# Patient Record
Sex: Male | Born: 1950 | Race: White | Hispanic: No | Smoking: Former smoker
Health system: Southern US, Community
[De-identification: ages and names within clinical notes are randomized; demographics above are authoritative.]

## PROBLEM LIST (undated history)

## (undated) HISTORY — PX: CORONARY ARTERY BYPASS GRAFT: SHX141

---

## 2017-02-25 ENCOUNTER — Emergency Department (HOSPITAL_COMMUNITY): Payer: Medicare (Managed Care) | Admitting: Anesthesiology

## 2017-02-25 ENCOUNTER — Encounter (HOSPITAL_COMMUNITY): Admission: EM | Disposition: E | Payer: Self-pay | Source: Home / Self Care | Attending: Surgery

## 2017-02-25 ENCOUNTER — Emergency Department (HOSPITAL_COMMUNITY): Payer: Medicare (Managed Care)

## 2017-02-25 ENCOUNTER — Inpatient Hospital Stay (HOSPITAL_COMMUNITY)
Admission: EM | Admit: 2017-02-25 | Discharge: 2017-03-07 | DRG: 268 | Disposition: E | Payer: Medicare (Managed Care) | Attending: Surgery | Admitting: Surgery

## 2017-02-25 ENCOUNTER — Encounter (HOSPITAL_COMMUNITY): Payer: Self-pay | Admitting: *Deleted

## 2017-02-25 DIAGNOSIS — R578 Other shock: Secondary | ICD-10-CM | POA: Diagnosis present

## 2017-02-25 DIAGNOSIS — R7989 Other specified abnormal findings of blood chemistry: Secondary | ICD-10-CM

## 2017-02-25 DIAGNOSIS — R079 Chest pain, unspecified: Secondary | ICD-10-CM | POA: Diagnosis present

## 2017-02-25 DIAGNOSIS — J9602 Acute respiratory failure with hypercapnia: Secondary | ICD-10-CM | POA: Diagnosis present

## 2017-02-25 DIAGNOSIS — I739 Peripheral vascular disease, unspecified: Secondary | ICD-10-CM | POA: Diagnosis present

## 2017-02-25 DIAGNOSIS — N179 Acute kidney failure, unspecified: Secondary | ICD-10-CM

## 2017-02-25 DIAGNOSIS — M79A3 Nontraumatic compartment syndrome of abdomen: Secondary | ICD-10-CM | POA: Diagnosis present

## 2017-02-25 DIAGNOSIS — Z951 Presence of aortocoronary bypass graft: Secondary | ICD-10-CM

## 2017-02-25 DIAGNOSIS — R402252 Coma scale, best verbal response, oriented, at arrival to emergency department: Secondary | ICD-10-CM | POA: Diagnosis present

## 2017-02-25 DIAGNOSIS — E872 Acidosis, unspecified: Secondary | ICD-10-CM

## 2017-02-25 DIAGNOSIS — Z01818 Encounter for other preprocedural examination: Secondary | ICD-10-CM

## 2017-02-25 DIAGNOSIS — Z87891 Personal history of nicotine dependence: Secondary | ICD-10-CM | POA: Diagnosis not present

## 2017-02-25 DIAGNOSIS — M545 Low back pain, unspecified: Secondary | ICD-10-CM

## 2017-02-25 DIAGNOSIS — J9601 Acute respiratory failure with hypoxia: Secondary | ICD-10-CM | POA: Diagnosis present

## 2017-02-25 DIAGNOSIS — E874 Mixed disorder of acid-base balance: Secondary | ICD-10-CM | POA: Diagnosis present

## 2017-02-25 DIAGNOSIS — W19XXXA Unspecified fall, initial encounter: Secondary | ICD-10-CM | POA: Diagnosis present

## 2017-02-25 DIAGNOSIS — I469 Cardiac arrest, cause unspecified: Secondary | ICD-10-CM | POA: Diagnosis present

## 2017-02-25 DIAGNOSIS — R748 Abnormal levels of other serum enzymes: Secondary | ICD-10-CM | POA: Diagnosis present

## 2017-02-25 DIAGNOSIS — J9622 Acute and chronic respiratory failure with hypercapnia: Secondary | ICD-10-CM | POA: Diagnosis not present

## 2017-02-25 DIAGNOSIS — R402313 Coma scale, best motor response, none, at hospital admission: Secondary | ICD-10-CM | POA: Diagnosis present

## 2017-02-25 DIAGNOSIS — E875 Hyperkalemia: Secondary | ICD-10-CM | POA: Diagnosis present

## 2017-02-25 DIAGNOSIS — R402113 Coma scale, eyes open, never, at hospital admission: Secondary | ICD-10-CM | POA: Diagnosis present

## 2017-02-25 DIAGNOSIS — D689 Coagulation defect, unspecified: Secondary | ICD-10-CM | POA: Diagnosis present

## 2017-02-25 DIAGNOSIS — J9621 Acute and chronic respiratory failure with hypoxia: Secondary | ICD-10-CM | POA: Diagnosis not present

## 2017-02-25 DIAGNOSIS — R402362 Coma scale, best motor response, obeys commands, at arrival to emergency department: Secondary | ICD-10-CM | POA: Diagnosis present

## 2017-02-25 DIAGNOSIS — R55 Syncope and collapse: Secondary | ICD-10-CM | POA: Diagnosis present

## 2017-02-25 DIAGNOSIS — R402213 Coma scale, best verbal response, none, at hospital admission: Secondary | ICD-10-CM | POA: Diagnosis present

## 2017-02-25 DIAGNOSIS — R000 Tachycardia, unspecified: Secondary | ICD-10-CM | POA: Diagnosis present

## 2017-02-25 DIAGNOSIS — R402142 Coma scale, eyes open, spontaneous, at arrival to emergency department: Secondary | ICD-10-CM | POA: Diagnosis present

## 2017-02-25 DIAGNOSIS — D62 Acute posthemorrhagic anemia: Secondary | ICD-10-CM | POA: Diagnosis present

## 2017-02-25 DIAGNOSIS — R778 Other specified abnormalities of plasma proteins: Secondary | ICD-10-CM

## 2017-02-25 DIAGNOSIS — I718 Aortic aneurysm of unspecified site, ruptured: Secondary | ICD-10-CM | POA: Diagnosis present

## 2017-02-25 DIAGNOSIS — I713 Abdominal aortic aneurysm, ruptured: Principal | ICD-10-CM | POA: Diagnosis present

## 2017-02-25 HISTORY — PX: ABDOMINAL AORTIC ANEURYSM REPAIR: SHX42

## 2017-02-25 HISTORY — PX: ABDOMINAL AORTIC ENDOVASCULAR STENT GRAFT: SHX5707

## 2017-02-25 LAB — CBC
HEMATOCRIT: 32 % — AB (ref 39.0–52.0)
HEMOGLOBIN: 10.2 g/dL — AB (ref 13.0–17.0)
MCH: 28.6 pg (ref 26.0–34.0)
MCHC: 31.9 g/dL (ref 30.0–36.0)
MCV: 89.6 fL (ref 78.0–100.0)
Platelets: 162 10*3/uL (ref 150–400)
RBC: 3.57 MIL/uL — ABNORMAL LOW (ref 4.22–5.81)
RDW: 15.3 % (ref 11.5–15.5)
WBC: 16.3 10*3/uL — AB (ref 4.0–10.5)

## 2017-02-25 LAB — I-STAT TROPONIN, ED: TROPONIN I, POC: 0.09 ng/mL — AB (ref 0.00–0.08)

## 2017-02-25 LAB — BASIC METABOLIC PANEL
ANION GAP: 19 — AB (ref 5–15)
BUN: 32 mg/dL — AB (ref 6–20)
CALCIUM: 7.9 mg/dL — AB (ref 8.9–10.3)
CO2: 14 mmol/L — ABNORMAL LOW (ref 22–32)
Chloride: 108 mmol/L (ref 101–111)
Creatinine, Ser: 3.74 mg/dL — ABNORMAL HIGH (ref 0.61–1.24)
GFR calc Af Amer: 18 mL/min — ABNORMAL LOW (ref >60)
GFR, EST NON AFRICAN AMERICAN: 16 mL/min — AB (ref >60)
Glucose, Bld: 156 mg/dL — ABNORMAL HIGH (ref 65–99)
POTASSIUM: 4.1 mmol/L (ref 3.5–5.1)
Sodium: 141 mmol/L (ref 135–145)

## 2017-02-25 LAB — CK: Total CK: 105 U/L (ref 49–397)

## 2017-02-25 LAB — I-STAT CG4 LACTIC ACID, ED: Lactic Acid, Venous: 10.36 mmol/L (ref 0.5–1.9)

## 2017-02-25 SURGERY — ANEURYSM ABDOMINAL AORTIC REPAIR
Anesthesia: General

## 2017-02-25 MED ORDER — SODIUM CHLORIDE 0.9 % IV BOLUS (SEPSIS)
500.0000 mL | Freq: Once | INTRAVENOUS | Status: AC
  Administered 2017-02-25: 500 mL via INTRAVENOUS

## 2017-02-25 MED ORDER — EPINEPHRINE PF 1 MG/ML IJ SOLN
0.5000 ug/min | INTRAMUSCULAR | Status: DC
  Administered 2017-02-26: 1 ug/min via INTRAVENOUS
  Administered 2017-02-26: 20 ug/min via INTRAVENOUS
  Filled 2017-02-25 (×2): qty 4

## 2017-02-25 MED ORDER — 0.9 % SODIUM CHLORIDE (POUR BTL) OPTIME
TOPICAL | Status: DC | PRN
  Administered 2017-02-25: 4000 mL

## 2017-02-25 MED ORDER — EPINEPHRINE PF 1 MG/10ML IJ SOSY
PREFILLED_SYRINGE | INTRAMUSCULAR | Status: AC | PRN
  Administered 2017-02-25 (×3): 1 mg via INTRAVENOUS

## 2017-02-25 MED ORDER — NOREPINEPHRINE BITARTRATE 1 MG/ML IV SOLN
0.0000 ug/min | INTRAVENOUS | Status: DC
  Administered 2017-02-26: 10 ug/min via INTRAVENOUS
  Filled 2017-02-25 (×2): qty 16

## 2017-02-25 MED ORDER — FENTANYL CITRATE (PF) 100 MCG/2ML IJ SOLN
INTRAMUSCULAR | Status: AC
  Filled 2017-02-25: qty 2

## 2017-02-25 MED ORDER — EPINEPHRINE PF 1 MG/10ML IJ SOSY
PREFILLED_SYRINGE | INTRAMUSCULAR | Status: AC | PRN
  Administered 2017-02-25: 1 mg via INTRAVENOUS

## 2017-02-25 MED ORDER — SODIUM CHLORIDE 0.9 % IV BOLUS (SEPSIS)
1000.0000 mL | Freq: Once | INTRAVENOUS | Status: AC
  Administered 2017-02-25: 1000 mL via INTRAVENOUS

## 2017-02-25 MED ORDER — FENTANYL CITRATE (PF) 100 MCG/2ML IJ SOLN
50.0000 ug | INTRAMUSCULAR | Status: DC | PRN
  Administered 2017-02-25: 50 ug via INTRAVENOUS

## 2017-02-25 MED ORDER — EPINEPHRINE PF 1 MG/10ML IJ SOSY
PREFILLED_SYRINGE | INTRAMUSCULAR | Status: AC | PRN
  Administered 2017-02-25 (×2): 1 mg via INTRAVENOUS

## 2017-02-25 MED ORDER — ONDANSETRON HCL 4 MG/2ML IJ SOLN
4.0000 mg | Freq: Once | INTRAMUSCULAR | Status: AC
  Administered 2017-02-25: 4 mg via INTRAVENOUS
  Filled 2017-02-25: qty 2

## 2017-02-25 MED ORDER — IOPAMIDOL (ISOVUE-370) INJECTION 76%
INTRAVENOUS | Status: AC
  Administered 2017-02-25: 100 mL
  Filled 2017-02-25: qty 100

## 2017-02-25 MED ORDER — SODIUM CHLORIDE 0.9 % IV SOLN
INTRAVENOUS | Status: DC | PRN
  Administered 2017-02-25: 1000 mL

## 2017-02-25 MED ORDER — SODIUM CHLORIDE 0.9 % IV SOLN
0.0300 [IU]/min | INTRAVENOUS | Status: DC
  Administered 2017-02-26: .03 [IU]/min via INTRAVENOUS
  Filled 2017-02-25: qty 2

## 2017-02-25 SURGICAL SUPPLY — 86 items
BAG SNAP BAND KOVER 36X36 (MISCELLANEOUS) ×4 IMPLANT
CANISTER SUCT 3000ML PPV (MISCELLANEOUS) ×4 IMPLANT
CATH OMNI FLUSH .035X70CM (CATHETERS) ×4 IMPLANT
CLIP VESOCCLUDE MED 24/CT (CLIP) ×4 IMPLANT
CLIP VESOCCLUDE SM WIDE 24/CT (CLIP) ×4 IMPLANT
CONNECTOR Y ATS VAC SYSTEM (MISCELLANEOUS) ×4 IMPLANT
COVER MAYO STAND STRL (DRAPES) ×8 IMPLANT
DERMABOND ADVANCED (GAUZE/BANDAGES/DRESSINGS) ×4
DERMABOND ADVANCED .7 DNX12 (GAUZE/BANDAGES/DRESSINGS) ×4 IMPLANT
DEVICE CLOSURE PERCLS PRGLD 6F (VASCULAR PRODUCTS) ×4 IMPLANT
DRAPE TABLE BACK 80X90 (DRAPES) ×4 IMPLANT
DRSG VAC ATS SM SENSATRAC (GAUZE/BANDAGES/DRESSINGS) ×8 IMPLANT
DRYSEAL FLEXSHEATH 12FR 33CM (SHEATH) ×2
DRYSEAL FLEXSHEATH 12FR 45CM (SHEATH) ×2
DRYSEAL FLEXSHEATH 16FR 33CM (SHEATH) ×2
DRYSEAL FLEXSHEATH 18FR 33CM (SHEATH) ×2
ELECT BLADE 4.0 EZ CLEAN MEGAD (MISCELLANEOUS) ×8
ELECT BLADE 6.5 EXT (BLADE) IMPLANT
ELECT REM PT RETURN 9FT ADLT (ELECTROSURGICAL) ×4
ELECTRODE BLDE 4.0 EZ CLN MEGD (MISCELLANEOUS) ×4 IMPLANT
ELECTRODE REM PT RTRN 9FT ADLT (ELECTROSURGICAL) ×2 IMPLANT
EXCLUDER TNK 28X14.5MMX12CM (Endovascular Graft) ×2 IMPLANT
EXCLUDER TRUNK 28X14.5MMX12CM (Endovascular Graft) ×4 IMPLANT
EXTENDER ENDOPROSTHESIS 12X7 (Endovascular Graft) ×4 IMPLANT
GLOVE BIO SURGEON STRL SZ 6.5 (GLOVE) ×9 IMPLANT
GLOVE BIO SURGEON STRL SZ7 (GLOVE) ×12 IMPLANT
GLOVE BIO SURGEONS STRL SZ 6.5 (GLOVE) ×3
GLOVE BIOGEL PI IND STRL 6.5 (GLOVE) ×6 IMPLANT
GLOVE BIOGEL PI IND STRL 7.0 (GLOVE) ×6 IMPLANT
GLOVE BIOGEL PI IND STRL 7.5 (GLOVE) ×2 IMPLANT
GLOVE BIOGEL PI INDICATOR 6.5 (GLOVE) ×6
GLOVE BIOGEL PI INDICATOR 7.0 (GLOVE) ×6
GLOVE BIOGEL PI INDICATOR 7.5 (GLOVE) ×2
GLOVE SURG SS PI 7.5 STRL IVOR (GLOVE) ×4 IMPLANT
GOWN STRL REUS W/ TWL LRG LVL3 (GOWN DISPOSABLE) ×8 IMPLANT
GOWN STRL REUS W/ TWL XL LVL3 (GOWN DISPOSABLE) ×4 IMPLANT
GOWN STRL REUS W/TWL LRG LVL3 (GOWN DISPOSABLE) ×8
GOWN STRL REUS W/TWL XL LVL3 (GOWN DISPOSABLE) ×4
GRAFT BALLN CATH 65CM (STENTS) ×2 IMPLANT
GUIDEWIRE AMPLATZ STIFF 0.35 (WIRE) ×12 IMPLANT
HEMOSTAT SNOW SURGICEL 2X4 (HEMOSTASIS) IMPLANT
INSERT FOGARTY 61MM (MISCELLANEOUS) ×8 IMPLANT
INSERT FOGARTY SM (MISCELLANEOUS) ×16 IMPLANT
KIT BASIN OR (CUSTOM PROCEDURE TRAY) ×4 IMPLANT
KIT ROOM TURNOVER OR (KITS) ×4 IMPLANT
LEG CONTRALATERAL 16X16X13.5 (Endovascular Graft) ×2 IMPLANT
LEG CONTRALATERAL 16X16X9.5 (Endovascular Graft) ×4 IMPLANT
NEEDLE PERC 18GX7CM (NEEDLE) ×4 IMPLANT
NS IRRIG 1000ML POUR BTL (IV SOLUTION) ×8 IMPLANT
PACK AORTA (CUSTOM PROCEDURE TRAY) ×4 IMPLANT
PAD ARMBOARD 7.5X6 YLW CONV (MISCELLANEOUS) ×8 IMPLANT
PERCLOSE PROGLIDE 6F (VASCULAR PRODUCTS) ×8
PROTECTION STATION PRESSURIZED (MISCELLANEOUS) ×4
SET MICROPUNCTURE 5F STIFF (MISCELLANEOUS) ×8 IMPLANT
SHEATH BRITE TIP 8FR 23CM (MISCELLANEOUS) ×4 IMPLANT
SHEATH DRYSEAL FLEX 12FR 33CM (SHEATH) ×2 IMPLANT
SHEATH DRYSEAL FLEX 12FR 45CM (SHEATH) ×2 IMPLANT
SHEATH DRYSEAL FLEX 16FR 33CM (SHEATH) ×2 IMPLANT
SHEATH DRYSEAL FLEX 18FR 33CM (SHEATH) ×2 IMPLANT
SHEATH PINNACLE 8F 10CM (SHEATH) ×4 IMPLANT
SPONGE LAP 18X18 X RAY DECT (DISPOSABLE) ×16 IMPLANT
STATION PROTECTION PRESSURIZED (MISCELLANEOUS) ×2 IMPLANT
STENT GRAFT BALLN CATH 65CM (STENTS) ×2
STENT GRAFT CONTRALAT 16X13.5 (Endovascular Graft) ×2 IMPLANT
STOPCOCK MORSE 400PSI 3WAY (MISCELLANEOUS) ×4 IMPLANT
SUT ETHIBOND 5 LR DA (SUTURE) IMPLANT
SUT PDS AB 1 TP1 54 (SUTURE) ×8 IMPLANT
SUT PROLENE 3 0 SH 48 (SUTURE) ×12 IMPLANT
SUT PROLENE 5 0 C 1 24 (SUTURE) ×16 IMPLANT
SUT PROLENE 5 0 C 1 36 (SUTURE) IMPLANT
SUT SILK 2 0SH CR/8 30 (SUTURE) ×4 IMPLANT
SUT VIC AB 2-0 CT1 27 (SUTURE) ×4
SUT VIC AB 2-0 CT1 TAPERPNT 27 (SUTURE) ×4 IMPLANT
SUT VIC AB 3-0 SH 27 (SUTURE)
SUT VIC AB 3-0 SH 27X BRD (SUTURE) IMPLANT
SUT VICRYL 4-0 PS2 18IN ABS (SUTURE) ×8 IMPLANT
SYR 20CC LL (SYRINGE) ×4 IMPLANT
SYR 30ML LL (SYRINGE) ×4 IMPLANT
SYR MEDRAD MARK V 150ML (SYRINGE) ×4 IMPLANT
TOWEL BLUE STERILE X RAY DET (MISCELLANEOUS) ×8 IMPLANT
TOWEL GREEN STERILE (TOWEL DISPOSABLE) ×4 IMPLANT
TRAY FOLEY W/METER SILVER 16FR (SET/KITS/TRAYS/PACK) ×4 IMPLANT
TUBING HIGH PRESSURE 120CM (CONNECTOR) ×4 IMPLANT
WATER STERILE IRR 1000ML POUR (IV SOLUTION) ×8 IMPLANT
WIRE BENTSON .035X145CM (WIRE) ×8 IMPLANT
WND VAC CANISTER 500ML (MISCELLANEOUS) ×12 IMPLANT

## 2017-02-25 NOTE — Code Documentation (Signed)
Unit 2 PRBCs started via [facility] ([id number])

## 2017-02-25 NOTE — Code Documentation (Addendum)
Vascular surgeon arrival time Brabham

## 2017-02-25 NOTE — Code Documentation (Signed)
Pt to OR with Ethan Avery, Greg, Barbham,

## 2017-02-25 NOTE — Code Documentation (Signed)
Lucas applied

## 2017-02-25 NOTE — ED Notes (Signed)
Sent add on label for hfp blood test.

## 2017-02-25 NOTE — Code Documentation (Signed)
1000cc NS complete via [facility]

## 2017-02-25 NOTE — Code Documentation (Signed)
Unit 4 PRBCs complete via [facility]

## 2017-02-25 NOTE — ED Provider Notes (Signed)
[facility]   CSN: [id number] Arrival date & time: 03/05/2017  1924     History   Chief Complaint Chief Complaint  Patient presents with  . Fall  . Chest Pain    HPI Ethan Avery is a 66 y.o. male.  Patient with coronary artery bypass history, previous smoker presents with severe lower back pain and chest pain. Patient had a few episodes of both the past 2 days however worsening. The back pain is more severe than the chest pain. No history of similar. No radiation down the legs. Patient did have hernia repair 2 months ago and has minimal shortness of breath. No history of blood clots. Patient has no abdominal pain.patient describes worsening pain followed by syncope approximately 12 hours ago. Patient lying on the ground for 12 hours unable to get up.      History reviewed. No pertinent past medical history.  There are no active problems to display for this patient.   Past Surgical History:  Procedure Laterality Date  . CORONARY ARTERY BYPASS GRAFT         Home Medications    Prior to Admission medications   Not on File    Family History No family history on file.  Social History Social History   Tobacco Use  . Smoking status: Former Smoker    Last attempt to quit: 03/08/2011    Years since quitting: 5.9  . Smokeless tobacco: Never Used  Substance Use Topics  . Alcohol use: No    Frequency: Never  . Drug use: No     Allergies   Patient has no known allergies.   Review of Systems Review of Systems  Constitutional: Positive for fatigue. Negative for chills and fever.  HENT: Negative for congestion.   Eyes: Negative for visual disturbance.  Respiratory: Positive for shortness of breath.   Cardiovascular: Positive for chest pain.  Gastrointestinal: Positive for nausea and vomiting. Negative for abdominal pain.  Genitourinary: Negative for dysuria and flank pain.  Musculoskeletal: Positive for  back pain. Negative for neck pain and neck stiffness.  Skin: Negative for rash.  Neurological: Negative for light-headedness and headaches.     Physical Exam Updated Vital Signs BP 124/67   Pulse 92   Temp 99 F (37.2 C) (Rectal)   Resp 17   SpO2 97%   Physical Exam  Constitutional: He is oriented to person, place, and time. He appears well-developed and well-nourished. He appears distressed.  HENT:  Head: Normocephalic and atraumatic.  Dry mucous membranes  Eyes: Conjunctivae are normal. Right eye exhibits no discharge. Left eye exhibits no discharge.  Neck: Normal range of motion. Neck supple. No tracheal deviation present.  Cardiovascular: Regular rhythm. Tachycardia present.  No murmur heard. Pulmonary/Chest: Effort normal and breath sounds normal.  Abdominal: Soft. He exhibits no distension. There is no tenderness. There is no guarding.  Musculoskeletal: He exhibits tenderness. He exhibits no edema.       Right lower leg: He exhibits no edema.       Left lower leg: He exhibits no edema.  No  Midline lumber or thoracic tenderness  Neurological: He is alert and oriented to person, place, and time. GCS eye subscore is 4. GCS verbal subscore is 5. GCS motor subscore is 6.  5+ strength in all extremities with gross flexion-extension.  Skin: Skin is warm. No rash noted. There is pallor.  Psychiatric: He has a normal mood and affect.  Nursing note and  vitals reviewed.    Ethan Treatments / Results  Labs (all labs ordered are listed, but only abnormal results are displayed) Labs Reviewed  BASIC METABOLIC PANEL - Abnormal; Notable for the following components:      Result Value   CO2 14 (*)    Glucose, Bld 156 (*)    BUN 32 (*)    Creatinine, Ser 3.74 (*)    Calcium 7.9 (*)    GFR calc non Af Amer 16 (*)    GFR calc Af Amer 18 (*)    Anion gap 19 (*)    All other components within normal limits  CBC - Abnormal; Notable for the following components:   WBC 16.3 (*)     RBC 3.57 (*)    Hemoglobin 10.2 (*)    HCT 32.0 (*)    All other components within normal limits  I-STAT TROPONIN, Ethan - Abnormal; Notable for the following components:   Troponin i, poc 0.09 (*)    All other components within normal limits  I-STAT CG4 LACTIC ACID, Ethan - Abnormal; Notable for the following components:   Lactic Acid, Venous 10.36 (*)    All other components within normal limits  CK  HEPATIC FUNCTION PANEL  I-STAT TROPONIN, Ethan  I-STAT CG4 LACTIC ACID, Ethan  TYPE AND SCREEN    EKG  EKG Interpretation  Date/Time:  Saturday March 27, 2017 19:28:47 EST Ventricular Rate:  116 PR Interval:    QRS Duration: 88 QT Interval:  369 QTC Calculation: 513 R Axis:   69 Text Interpretation:  Sinus tachycardia Abnormal R-wave progression, late transition Inferior infarct, old Prolonged QT interval Baseline wander in lead(s) II aVF Confirmed by Blane Ohara [phone]) on 2017-03-27 7:35:36 PM       Radiology Dg Chest 2 View  Result Date: 03-27-2017 CLINICAL DATA:  66 year old male with chest pain. EXAM: CHEST  2 VIEW COMPARISON:  None. FINDINGS: There is shallow inspiration with minimal bibasilar atelectasis. No focal consolidation, pleural effusion, or pneumothorax. Mild cardiomegaly. Median sternotomy wires and CABG vascular clips noted. No acute osseous pathology. IMPRESSION: No active cardiopulmonary disease. Electronically Signed   By: Elgie Collard M.D.   On: 2017/03/27 20:39    Procedures .Critical Care Performed by: Blane Ohara, MD Authorized by: Blane Ohara, MD   Critical care provider statement:    Critical care time (minutes):  75   Critical care start time:  27-Mar-2017 8:30 PM   Critical care end time:  2017-03-27 9:45 PM   Critical care time was exclusive of:  Separately billable procedures and treating other patients and teaching time   Critical care was necessary to treat or prevent imminent or life-threatening deterioration of the following  conditions:  Circulatory failure   Critical care was time spent personally by me on the following activities:  Examination of patient, ordering and review of radiographic studies, re-evaluation of patient's condition and obtaining history from patient or surrogate   I assumed direction of critical care for this patient from another provider in my specialty: no      (including critical care time) Cardiopulmonary Resuscitation (CPR) Procedure Note Directed/Performed by: Enid Skeens I personally directed ancillary staff and/or performed CPR in an effort to regain return of spontaneous circulation and to maintain cardiac, neuro and systemic perfusion.   Medications Ordered in Ethan Medications  fentaNYL (SUBLIMAZE) injection 50 mcg (50 mcg Intravenous Given 27-Mar-2017 2105)  sodium chloride 0.9 % bolus 1,000 mL (not administered)  ondansetron (ZOFRAN) injection 4  mg (not administered)  sodium chloride 0.9 % bolus 500 mL (500 mLs Intravenous New Bag/Given 02/10/2017 2035)  sodium chloride 0.9 % bolus 1,000 mL (0 mLs Intravenous Stopped 02/23/2017 2120)  iopamidol (ISOVUE-370) 76 % injection (100 mLs  Contrast Given 02/16/2017 2104)     Initial Impression / Assessment and Plan / Ethan Course  I have reviewed the triage vital signs and the nursing notes.  Pertinent labs & imaging results that were available during my care of the patient were reviewed by me and considered in my medical decision making (see chart for details).    Patient presents with severe back pain and chest pain. No history of similar. Patient looks pale and the room started vomiting, possibly bloody component. Differential broad on arrival and potentially severe presentation. Plan for CT scan to further evaluate. Contrast utilized due to elevated lactate of 10 and concern for possibly aortic pathology. Discussed with radiology and with patient in detail about potential risk of worsening kidney function with using contrast. Patient and  son okay to proceed. IV fluid boluses and second IV placed. Patient sent immediately over CT scan.  Patient became pale and having worsening pain after CT scan. Patient's blood pressure dropped and shortly after became unresponsive.  CPR initiated, IV fluid bolus initiated.  Immediately requested multiple units of packed red blood cells.  Rapid transfuser ordered and arranged.  Aggressive resuscitation efforts and multiple discussions with family members.  Discussion with vascular surgeon who came down to assist in the emergency department.  We were able to get pulse back and patient went emergently to the operating room.  Family understands patient is very critical and unlikely to survive.   Final Clinical Impressions(s) / Ethan Diagnoses   Final diagnoses:  Lactic acidosis  Acute bilateral low back pain without sciatica  Acute renal failure, unspecified acute renal failure type (HCC)  Elevated troponin  Abdominal aortic aneurysm with rupture Cardiac arrest  Ethan Discharge Orders    None       Blane OharaZavitz, Anslee Micheletti, MD 02/27/17 0004

## 2017-02-25 NOTE — Code Documentation (Addendum)
Emergent release PRBCs unit 1 started via [facility] ([id number])

## 2017-02-25 NOTE — Code Documentation (Signed)
Unit 3 PRBCs started via [facility]

## 2017-02-25 NOTE — ED Triage Notes (Signed)
[facility], pt reported he fell to floor d/t mid radiating CP to mid back with vomiting episode at home, pt rcvd 324 mg ASA & x 1 sL nitro with pain relief 10/10 to 0/10, pt rcvd 4 mg Zofran pta, pt c/o SOB with 2.;5 mg Albuterol given by EMS with wheezing improved, pt tachypnea with labored breathing upon arrival to ED, pt ST in route, pt speaks in short sentences, pt A&O x4, CBG 186

## 2017-02-25 NOTE — Code Documentation (Signed)
1000cc NS bag complete via [facility]

## 2017-02-25 NOTE — ED Notes (Signed)
Dr Jodi MourningZavitz given a copy of troponin .09 and lactic acid 10.36

## 2017-02-25 NOTE — Code Documentation (Signed)
Unit 1 plasma complete via [facility]

## 2017-02-25 NOTE — Code Documentation (Signed)
1000cc NS via pressure bag

## 2017-02-25 NOTE — Code Documentation (Signed)
Abdominal distention and rigidity noted

## 2017-02-25 NOTE — Code Documentation (Signed)
Unit 5 PRBCs started via [facility]

## 2017-02-25 NOTE — Code Documentation (Signed)
Unit 1 plasma via [facility] started

## 2017-02-25 NOTE — ED Notes (Signed)
Patient transported to CT 

## 2017-02-25 NOTE — Code Documentation (Signed)
Unit 6 PRBCs complete via [facility]

## 2017-02-25 NOTE — Code Documentation (Signed)
1000cc NS via [facility] at this time

## 2017-02-25 NOTE — Code Documentation (Signed)
PRBCs Unit 1 complete

## 2017-02-25 NOTE — ED Notes (Signed)
ED Provider at bedside. 

## 2017-02-25 NOTE — ED Provider Notes (Signed)
INTUBATION Performed by: Mortimer FriesNick Letrice Pollok  Required items: required blood products, implants, devices, and special equipment available Patient identity confirmed: provided demographic data and hospital-assigned identification number Time out: Immediately prior to procedure a "time out" was called to verify the correct patient, procedure, equipment, support staff and site/side marked as required.  Indications: cardiac arrest  Intubation method: Direct Laryngoscopy   Preoxygenation: BVM  Sedatives: None Paralytic: None  Tube Size: 7.5cuffed  Post-procedure assessment: ETCO2 detector Breath sounds: equal and absent over the epigastrium Tube secured with: Tape Chest x-ray not performed due to acuity of condition.  Patient tolerated the procedure well with no immediate complications.     Forest BeckerPetit, Delcie Ruppert, MD 02/10/2017 2229    Blane OharaZavitz, Joshua, MD 11/27/16 [phone]

## 2017-02-25 NOTE — Code Documentation (Signed)
Unit 2 plasma started via [facility]

## 2017-02-25 NOTE — Code Documentation (Signed)
Pt arrival in OR

## 2017-02-25 NOTE — Code Documentation (Signed)
Unit 4 PRBCs started via [facility]

## 2017-02-25 NOTE — Code Documentation (Signed)
Unit 2 PRBCs complete

## 2017-02-25 NOTE — ED Notes (Signed)
CPR

## 2017-02-25 NOTE — Code Documentation (Signed)
Unit 3 PRBCs complete via [facility]

## 2017-02-25 NOTE — Anesthesia Preprocedure Evaluation (Addendum)
Anesthesia Evaluation  Patient identified by MRN, date of birth, ID band Patient unresponsive  Preop documentation limited or incomplete due to emergent nature of procedure.  Airway Mallampati: Intubated       Dental   Pulmonary former smoker,     + decreased breath sounds      Cardiovascular + CABG and + Peripheral Vascular Disease   Rhythm:irregular Rate:Tachycardia  AAA.  Pulseless and coding upon arrival to OR.   Neuro/Psych    GI/Hepatic   Endo/Other    Renal/GU      Musculoskeletal   Abdominal   Peds  Hematology   Anesthesia Other Findings   Reproductive/Obstetrics                             Anesthesia Physical Anesthesia Plan  ASA: V and emergent  Anesthesia Plan: General   Post-op Pain Management:    Induction: Intravenous  PONV Risk Score and Plan: 2 and Treatment may vary due to age or medical condition  Airway Management Planned: Oral ETT  Additional Equipment: Arterial line and CVP  Intra-op Plan:   Post-operative Plan: Post-operative intubation/ventilation  Informed Consent:   Only emergency history available and History available from chart only  Plan Discussed with: CRNA, Anesthesiologist and Surgeon  Anesthesia Plan Comments:        Anesthesia Quick Evaluation

## 2017-02-25 NOTE — Code Documentation (Signed)
Unit 5 PRBCs complete via [facility]; unit 6 PRBCs started via [facility]

## 2017-02-26 ENCOUNTER — Inpatient Hospital Stay (HOSPITAL_COMMUNITY): Payer: Medicare (Managed Care)

## 2017-02-26 ENCOUNTER — Encounter (HOSPITAL_COMMUNITY): Payer: Self-pay | Admitting: *Deleted

## 2017-02-26 DIAGNOSIS — D689 Coagulation defect, unspecified: Secondary | ICD-10-CM

## 2017-02-26 DIAGNOSIS — I713 Abdominal aortic aneurysm, ruptured: Secondary | ICD-10-CM

## 2017-02-26 DIAGNOSIS — J9621 Acute and chronic respiratory failure with hypoxia: Secondary | ICD-10-CM

## 2017-02-26 DIAGNOSIS — R578 Other shock: Secondary | ICD-10-CM

## 2017-02-26 DIAGNOSIS — J9622 Acute and chronic respiratory failure with hypercapnia: Secondary | ICD-10-CM

## 2017-02-26 LAB — PROTIME-INR
INR: 7.85
INR: 5.09
PROTHROMBIN TIME: 46.7 s — AB (ref 11.4–15.2)
Prothrombin Time: 65.5 seconds — ABNORMAL HIGH (ref 11.4–15.2)

## 2017-02-26 LAB — BPAM PLATELET PHERESIS
BLOOD PRODUCT EXPIRATION DATE: 201812232359
Blood Product Expiration Date: 201812232359
Blood Product Expiration Date: 201812242359
Blood Product Expiration Date: 201812242359
ISSUE DATE / TIME: 201812222224
ISSUE DATE / TIME: 201812220059
UNIT TYPE AND RH: 2800
UNIT TYPE AND RH: 7300
Unit Type and Rh: 5100
Unit Type and Rh: 7300

## 2017-02-26 LAB — CBC
HCT: 14.7 % — ABNORMAL LOW (ref 39.0–52.0)
HEMATOCRIT: 20.7 % — AB (ref 39.0–52.0)
Hemoglobin: 4.5 g/dL — CL (ref 13.0–17.0)
Hemoglobin: 6.6 g/dL — CL (ref 13.0–17.0)
MCH: 28.8 pg (ref 26.0–34.0)
MCH: 28.9 pg (ref 26.0–34.0)
MCHC: 30.6 g/dL (ref 30.0–36.0)
MCHC: 31.9 g/dL (ref 30.0–36.0)
MCV: 94.2 fL (ref 78.0–100.0)
MCV: 90.8 fL (ref 78.0–100.0)
PLATELETS: 40 10*3/uL — AB (ref 150–400)
PLATELETS: 58 10*3/uL — AB (ref 150–400)
RBC: 1.56 MIL/uL — ABNORMAL LOW (ref 4.22–5.81)
RBC: 2.28 MIL/uL — ABNORMAL LOW (ref 4.22–5.81)
RDW: 15.1 % (ref 11.5–15.5)
RDW: 15.9 % — AB (ref 11.5–15.5)
WBC: 11.2 10*3/uL — ABNORMAL HIGH (ref 4.0–10.5)
WBC: 4.3 10*3/uL (ref 4.0–10.5)

## 2017-02-26 LAB — MRSA PCR SCREENING: MRSA BY PCR: NEGATIVE

## 2017-02-26 LAB — POCT I-STAT 7, (LYTES, BLD GAS, ICA,H+H)
ACID-BASE DEFICIT: 21 mmol/L — AB (ref 0.0–2.0)
Acid-base deficit: 23 mmol/L — ABNORMAL HIGH (ref 0.0–2.0)
Acid-base deficit: 26 mmol/L — ABNORMAL HIGH (ref 0.0–2.0)
BICARBONATE: 12.1 mmol/L — AB (ref 20.0–28.0)
Bicarbonate: 12.8 mmol/L — ABNORMAL LOW (ref 20.0–28.0)
Bicarbonate: 6.6 mmol/L — ABNORMAL LOW (ref 20.0–28.0)
CALCIUM ION: 1 mmol/L — AB (ref 1.15–1.40)
Calcium, Ion: 0.73 mmol/L — CL (ref 1.15–1.40)
Calcium, Ion: 0.92 mmol/L — ABNORMAL LOW (ref 1.15–1.40)
HCT: 18 % — ABNORMAL LOW (ref 39.0–52.0)
HCT: 29 % — ABNORMAL LOW (ref 39.0–52.0)
HEMATOCRIT: 27 % — AB (ref 39.0–52.0)
HEMOGLOBIN: 6.1 g/dL — AB (ref 13.0–17.0)
Hemoglobin: 9.9 g/dL — ABNORMAL LOW (ref 13.0–17.0)
Hemoglobin: 9.2 g/dL — ABNORMAL LOW (ref 13.0–17.0)
O2 SAT: 98 %
O2 Saturation: 66 %
O2 Saturation: 46 %
PCO2 ART: 38.4 mmHg (ref 32.0–48.0)
PCO2 ART: 71.1 mmHg — AB (ref 32.0–48.0)
PO2 ART: 184 mmHg — AB (ref 83.0–108.0)
PO2 ART: 53 mmHg — AB (ref 83.0–108.0)
Patient temperature: 34
Potassium: 4.8 mmol/L (ref 3.5–5.1)
Potassium: 5.2 mmol/L — ABNORMAL HIGH (ref 3.5–5.1)
Potassium: 6.8 mmol/L (ref 3.5–5.1)
SODIUM: 152 mmol/L — AB (ref 135–145)
Sodium: 147 mmol/L — ABNORMAL HIGH (ref 135–145)
Sodium: 151 mmol/L — ABNORMAL HIGH (ref 135–145)
TCO2: 15 mmol/L — ABNORMAL LOW (ref 22–32)
TCO2: 15 mmol/L — ABNORMAL LOW (ref 22–32)
TCO2: 8 mmol/L — ABNORMAL LOW (ref 22–32)
pCO2 arterial: 67.4 mmHg (ref 32.0–48.0)
pH, Arterial: 6.813 — CL (ref 7.350–7.450)
pH, Arterial: 6.821 — CL (ref 7.350–7.450)
pH, Arterial: 6.856 — CL (ref 7.350–7.450)
pO2, Arterial: 36 mmHg — CL (ref 83.0–108.0)

## 2017-02-26 LAB — POCT I-STAT 4, (NA,K, GLUC, HGB,HCT)
GLUCOSE: 291 mg/dL — AB (ref 65–99)
HEMATOCRIT: 15 % — AB (ref 39.0–52.0)
HEMOGLOBIN: 5.1 g/dL — AB (ref 13.0–17.0)
POTASSIUM: 6.7 mmol/L — AB (ref 3.5–5.1)
SODIUM: 148 mmol/L — AB (ref 135–145)

## 2017-02-26 LAB — APTT: aPTT: 114 seconds — ABNORMAL HIGH (ref 24–36)

## 2017-02-26 LAB — PREPARE PLATELET PHERESIS
UNIT DIVISION: 0
UNIT DIVISION: 0
Unit division: 0
Unit division: 0

## 2017-02-26 LAB — FIBRINOGEN: Fibrinogen: <60 mg/dL — CL (ref 210–475)

## 2017-02-26 LAB — ABO/RH: ABO/RH(D): B POS

## 2017-02-26 LAB — POCT I-STAT 3, ART BLOOD GAS (G3+)
ACID-BASE DEFICIT: 19 mmol/L — AB (ref 0.0–2.0)
Bicarbonate: 14.2 mmol/L — ABNORMAL LOW (ref 20.0–28.0)
O2 SAT: 31 %
TCO2: 17 mmol/L — ABNORMAL LOW (ref 22–32)
pCO2 arterial: 68.9 mmHg (ref 32.0–48.0)
pH, Arterial: 6.89 — CL (ref 7.350–7.450)
pO2, Arterial: 26 mmHg — CL (ref 83.0–108.0)

## 2017-02-26 MED ORDER — SODIUM CHLORIDE 0.9 % IV SOLN: Freq: Once | INTRAVENOUS | Status: DC

## 2017-02-26 MED ORDER — INSULIN ASPART 100 UNIT/ML ~~LOC~~ SOLN
SUBCUTANEOUS | Status: AC
  Filled 2017-02-26: qty 1

## 2017-02-26 MED ORDER — PANTOPRAZOLE SODIUM 40 MG IV SOLR: 40.0000 mg | Freq: Every day | INTRAVENOUS | Status: DC

## 2017-02-26 MED ORDER — FENTANYL CITRATE (PF) 100 MCG/2ML IJ SOLN: 50.0000 ug | INTRAMUSCULAR | Status: DC | PRN

## 2017-02-26 MED ORDER — SODIUM BICARBONATE 8.4 % IV SOLN
100.0000 meq | Freq: Once | INTRAVENOUS | Status: AC
  Administered 2017-02-26: 100 meq via INTRAVENOUS

## 2017-02-26 MED ORDER — SODIUM BICARBONATE 8.4 % IV SOLN
INTRAVENOUS | Status: AC
  Filled 2017-02-26: qty 250

## 2017-02-26 MED ORDER — MIDAZOLAM HCL 2 MG/2ML IJ SOLN: 1.0000 mg | INTRAMUSCULAR | Status: DC | PRN

## 2017-02-26 MED ORDER — ROCURONIUM BROMIDE 100 MG/10ML IV SOLN
INTRAVENOUS | Status: DC | PRN
  Administered 2017-02-25 – 2017-02-26 (×4): 50 mg via INTRAVENOUS

## 2017-02-26 MED ORDER — ORAL CARE MOUTH RINSE: 15.0000 mL | Freq: Four times a day (QID) | OROMUCOSAL | Status: DC

## 2017-02-26 MED ORDER — SODIUM BICARBONATE 8.4 % IV SOLN
100.0000 meq | Freq: Once | INTRAVENOUS | Status: AC
  Administered 2017-02-26: 100 meq via INTRAVENOUS
  Filled 2017-02-26: qty 100

## 2017-02-26 MED ORDER — CALCIUM CHLORIDE 10 % IV SOLN
INTRAVENOUS | Status: DC | PRN
  Administered 2017-02-25: 1 g via INTRAVENOUS
  Administered 2017-02-26 (×4): 0.5 g via INTRAVENOUS
  Administered 2017-02-26: 1 g via INTRAVENOUS
  Administered 2017-02-26: 0.5 g via INTRAVENOUS

## 2017-02-26 MED ORDER — CHLORHEXIDINE GLUCONATE 0.12% ORAL RINSE (MEDLINE KIT): 15.0000 mL | Freq: Two times a day (BID) | OROMUCOSAL | Status: DC

## 2017-02-26 MED ORDER — PHENYLEPHRINE HCL 10 MG/ML IJ SOLN
INTRAVENOUS | Status: DC | PRN
  Administered 2017-02-25: 50 ug/min via INTRAVENOUS

## 2017-02-26 MED ORDER — SODIUM CHLORIDE 0.9 % IV SOLN
1.0000 g | Freq: Once | INTRAVENOUS | Status: AC
  Administered 2017-02-26: 1 g via INTRAVENOUS
  Filled 2017-02-26: qty 10

## 2017-02-26 MED ORDER — PROPOFOL 1000 MG/100ML IV EMUL: 0.0000 ug/kg/min | INTRAVENOUS | Status: DC

## 2017-02-26 MED ORDER — SODIUM CHLORIDE 0.9 % IV SOLN
INTRAVENOUS | Status: DC | PRN
  Administered 2017-02-25 – 2017-02-26 (×5): via INTRAVENOUS

## 2017-02-26 MED ORDER — SODIUM CHLORIDE 0.9 % IV SOLN
INTRAVENOUS | Status: DC | PRN
  Administered 2017-02-26 (×2): 10 [IU] via INTRAVENOUS

## 2017-02-26 MED ORDER — LACTATED RINGERS IV SOLN
INTRAVENOUS | Status: DC | PRN
  Administered 2017-02-25 (×3): via INTRAVENOUS

## 2017-02-26 MED ORDER — CALCIUM CHLORIDE 10 % IV SOLN
INTRAVENOUS | Status: AC
  Filled 2017-02-26: qty 20

## 2017-02-26 MED ORDER — DEXTROSE 50 % IV SOLN
INTRAVENOUS | Status: DC | PRN
  Administered 2017-02-26: 1 via INTRAVENOUS

## 2017-02-26 MED ORDER — LACTATED RINGERS IV BOLUS (SEPSIS)
1000.0000 mL | INTRAVENOUS | Status: DC | PRN
  Administered 2017-02-26: 4000 mL via INTRAVENOUS

## 2017-02-26 MED ORDER — IODIXANOL 320 MG/ML IV SOLN
INTRAVENOUS | Status: DC | PRN
  Administered 2017-02-26: 250 mL via INTRAVENOUS

## 2017-02-26 MED ORDER — CEFAZOLIN SODIUM-DEXTROSE 2-3 GM-%(50ML) IV SOLR
INTRAVENOUS | Status: DC | PRN
  Administered 2017-02-25: 2 g via INTRAVENOUS

## 2017-02-26 MED ORDER — SODIUM BICARBONATE 8.4 % IV SOLN
INTRAVENOUS | Status: DC
  Administered 2017-02-26: 04:00:00 via INTRAVENOUS
  Filled 2017-02-26 (×3): qty 850

## 2017-02-26 MED ORDER — SODIUM BICARBONATE 8.4 % IV SOLN
INTRAVENOUS | Status: DC | PRN
  Administered 2017-02-26: 50 meq via INTRAVENOUS
  Administered 2017-02-26: 100 meq via INTRAVENOUS
  Administered 2017-02-26: 50 meq via INTRAVENOUS
  Administered 2017-02-26 (×2): 100 meq via INTRAVENOUS
  Administered 2017-02-26: 50 meq via INTRAVENOUS

## 2017-02-26 MED ORDER — EPINEPHRINE PF 1 MG/10ML IJ SOSY
PREFILLED_SYRINGE | INTRAMUSCULAR | Status: DC | PRN
  Administered 2017-02-25 – 2017-02-26 (×6): 1 mg via INTRAVENOUS

## 2017-02-26 MED ORDER — CEFAZOLIN SODIUM-DEXTROSE 1-4 GM/50ML-% IV SOLN
INTRAVENOUS | Status: DC | PRN
  Administered 2017-02-26: 1 g via INTRAVENOUS

## 2017-02-26 MED ORDER — COAGULATION FACTOR VIIA RECOMB 1 MG IV SOLR
5.0000 mg | Freq: Once | INTRAVENOUS | Status: AC
  Administered 2017-02-26: 5 mg via INTRAVENOUS
  Filled 2017-02-26 (×2): qty 5

## 2017-02-27 ENCOUNTER — Encounter (HOSPITAL_COMMUNITY): Payer: Self-pay | Admitting: Surgery

## 2017-02-27 LAB — PREPARE FRESH FROZEN PLASMA
UNIT DIVISION: 0
UNIT DIVISION: 0
UNIT DIVISION: 0
UNIT DIVISION: 0
UNIT DIVISION: 0
UNIT DIVISION: 0
UNIT DIVISION: 0
Unit division: 0
Unit division: 0
Unit division: 0
Unit division: 0
Unit division: 0
Unit division: 0
Unit division: 0
Unit division: 0
Unit division: 0
Unit division: 0
Unit division: 0
Unit division: 0
Unit division: 0
Unit division: 0
Unit division: 0
Unit division: 0
Unit division: 0
Unit division: 0

## 2017-02-27 LAB — BPAM FFP
BLOOD PRODUCT EXPIRATION DATE: 201812252359
BLOOD PRODUCT EXPIRATION DATE: 201812252359
BLOOD PRODUCT EXPIRATION DATE: 201812272359
BLOOD PRODUCT EXPIRATION DATE: 201812272359
BLOOD PRODUCT EXPIRATION DATE: 201812272359
BLOOD PRODUCT EXPIRATION DATE: 201812272359
BLOOD PRODUCT EXPIRATION DATE: 201812282359
BLOOD PRODUCT EXPIRATION DATE: 201812282359
BLOOD PRODUCT EXPIRATION DATE: 201812282359
BLOOD PRODUCT EXPIRATION DATE: 201812282359
BLOOD PRODUCT EXPIRATION DATE: 201812282359
BLOOD PRODUCT EXPIRATION DATE: 201812282359
BLOOD PRODUCT EXPIRATION DATE: 201812282359
Blood Product Expiration Date: 201812232359
Blood Product Expiration Date: 201812232359
Blood Product Expiration Date: 201812252359
Blood Product Expiration Date: 201812252359
Blood Product Expiration Date: 201812262359
Blood Product Expiration Date: 201812272359
Blood Product Expiration Date: 201812272359
Blood Product Expiration Date: 201812282359
Blood Product Expiration Date: 201812282359
Blood Product Expiration Date: 201812282359
Blood Product Expiration Date: 201812282359
Blood Product Expiration Date: 201812282359
ISSUE DATE / TIME: 201812222145
ISSUE DATE / TIME: 201812222145
ISSUE DATE / TIME: 201812230030
ISSUE DATE / TIME: 201812230049
ISSUE DATE / TIME: 201812230133
ISSUE DATE / TIME: 201812230133
ISSUE DATE / TIME: 201812222151
ISSUE DATE / TIME: 201812222151
ISSUE DATE / TIME: 201812222151
ISSUE DATE / TIME: 201812222151
ISSUE DATE / TIME: 201812222346
ISSUE DATE / TIME: 201812222346
ISSUE DATE / TIME: 201812222346
ISSUE DATE / TIME: 201812222346
ISSUE DATE / TIME: 201812230030
ISSUE DATE / TIME: 201812230030
ISSUE DATE / TIME: 201812230133
ISSUE DATE / TIME: 201812230133
UNIT TYPE AND RH: 1700
UNIT TYPE AND RH: 1700
UNIT TYPE AND RH: 6200
UNIT TYPE AND RH: 6200
UNIT TYPE AND RH: 6200
UNIT TYPE AND RH: 7300
UNIT TYPE AND RH: 7300
UNIT TYPE AND RH: 7300
UNIT TYPE AND RH: 7300
UNIT TYPE AND RH: 7300
UNIT TYPE AND RH: 7300
UNIT TYPE AND RH: 7300
UNIT TYPE AND RH: 8400
UNIT TYPE AND RH: 8400
UNIT TYPE AND RH: 8400
Unit Type and Rh: 6200
Unit Type and Rh: 6200
Unit Type and Rh: 6200
Unit Type and Rh: 7300
Unit Type and Rh: 7300
Unit Type and Rh: 7300
Unit Type and Rh: 7300
Unit Type and Rh: 7300
Unit Type and Rh: 8400
Unit Type and Rh: 8400

## 2017-02-27 LAB — BPAM RBC
BLOOD PRODUCT EXPIRATION DATE: 201812282359
BLOOD PRODUCT EXPIRATION DATE: 201812292359
BLOOD PRODUCT EXPIRATION DATE: 201901072359
BLOOD PRODUCT EXPIRATION DATE: 201901072359
BLOOD PRODUCT EXPIRATION DATE: 201901092359
BLOOD PRODUCT EXPIRATION DATE: 201901122359
BLOOD PRODUCT EXPIRATION DATE: 201901122359
BLOOD PRODUCT EXPIRATION DATE: 201901132359
BLOOD PRODUCT EXPIRATION DATE: 201901132359
BLOOD PRODUCT EXPIRATION DATE: 201901172359
BLOOD PRODUCT EXPIRATION DATE: 201901182359
BLOOD PRODUCT EXPIRATION DATE: 201901182359
BLOOD PRODUCT EXPIRATION DATE: 201901182359
BLOOD PRODUCT EXPIRATION DATE: 201901182359
BLOOD PRODUCT EXPIRATION DATE: 201901182359
BLOOD PRODUCT EXPIRATION DATE: 201901212359
BLOOD PRODUCT EXPIRATION DATE: 201901222359
BLOOD PRODUCT EXPIRATION DATE: 201901222359
BLOOD PRODUCT EXPIRATION DATE: 201901222359
Blood Product Expiration Date: 201901072359
Blood Product Expiration Date: 201901072359
Blood Product Expiration Date: 201901092359
Blood Product Expiration Date: 201901122359
Blood Product Expiration Date: 201901122359
Blood Product Expiration Date: 201901132359
Blood Product Expiration Date: 201901132359
Blood Product Expiration Date: 201901172359
Blood Product Expiration Date: 201901172359
Blood Product Expiration Date: 201901182359
Blood Product Expiration Date: 201901182359
Blood Product Expiration Date: 201901182359
Blood Product Expiration Date: 201901212359
Blood Product Expiration Date: 201901222359
Blood Product Expiration Date: 201901222359
ISSUE DATE / TIME: 201812222153
ISSUE DATE / TIME: 201812222153
ISSUE DATE / TIME: 201812222153
ISSUE DATE / TIME: 201812222153
ISSUE DATE / TIME: 201812222227
ISSUE DATE / TIME: 201812222227
ISSUE DATE / TIME: 201812222227
ISSUE DATE / TIME: 201812222310
ISSUE DATE / TIME: 201812222310
ISSUE DATE / TIME: 201812222355
ISSUE DATE / TIME: 201812222355
ISSUE DATE / TIME: 201812230033
ISSUE DATE / TIME: 201812230033
ISSUE DATE / TIME: 201812230421
ISSUE DATE / TIME: 201812231023
ISSUE DATE / TIME: 201812231241
ISSUE DATE / TIME: 201812231354
ISSUE DATE / TIME: 201812231729
ISSUE DATE / TIME: 201812240607
ISSUE DATE / TIME: 201812241033
ISSUE DATE / TIME: 201812222143
ISSUE DATE / TIME: 201812222143
ISSUE DATE / TIME: 201812222227
ISSUE DATE / TIME: 201812222310
ISSUE DATE / TIME: 201812222310
ISSUE DATE / TIME: 201812222355
ISSUE DATE / TIME: 201812222355
ISSUE DATE / TIME: 201812230033
ISSUE DATE / TIME: 201812230033
ISSUE DATE / TIME: 201812230140
ISSUE DATE / TIME: 201812230421
ISSUE DATE / TIME: 201812231108
ISSUE DATE / TIME: 201812241033
UNIT TYPE AND RH: 5100
UNIT TYPE AND RH: 5100
UNIT TYPE AND RH: 5100
UNIT TYPE AND RH: 5100
UNIT TYPE AND RH: 7300
UNIT TYPE AND RH: 7300
UNIT TYPE AND RH: 7300
UNIT TYPE AND RH: 7300
UNIT TYPE AND RH: 7300
UNIT TYPE AND RH: 7300
UNIT TYPE AND RH: 7300
UNIT TYPE AND RH: 7300
UNIT TYPE AND RH: 9500
UNIT TYPE AND RH: 9500
UNIT TYPE AND RH: 9500
Unit Type and Rh: 5100
Unit Type and Rh: 5100
Unit Type and Rh: 5100
Unit Type and Rh: 5100
Unit Type and Rh: 7300
Unit Type and Rh: 7300
Unit Type and Rh: 7300
Unit Type and Rh: 7300
Unit Type and Rh: 7300
Unit Type and Rh: 7300
Unit Type and Rh: 7300
Unit Type and Rh: 7300
Unit Type and Rh: 7300
Unit Type and Rh: 7300
Unit Type and Rh: 7300
Unit Type and Rh: 7300
Unit Type and Rh: 9500
Unit Type and Rh: 9500
Unit Type and Rh: 9500

## 2017-02-27 LAB — TYPE AND SCREEN
ABO/RH(D): B POS
ANTIBODY SCREEN: NEGATIVE
UNIT DIVISION: 0
UNIT DIVISION: 0
UNIT DIVISION: 0
UNIT DIVISION: 0
UNIT DIVISION: 0
UNIT DIVISION: 0
UNIT DIVISION: 0
UNIT DIVISION: 0
UNIT DIVISION: 0
UNIT DIVISION: 0
UNIT DIVISION: 0
UNIT DIVISION: 0
UNIT DIVISION: 0
UNIT DIVISION: 0
UNIT DIVISION: 0
UNIT DIVISION: 0
Unit division: 0
Unit division: 0
Unit division: 0
Unit division: 0
Unit division: 0
Unit division: 0
Unit division: 0
Unit division: 0
Unit division: 0
Unit division: 0
Unit division: 0
Unit division: 0
Unit division: 0
Unit division: 0
Unit division: 0
Unit division: 0
Unit division: 0
Unit division: 0

## 2017-02-27 LAB — PREPARE CRYOPRECIPITATE
UNIT DIVISION: 0
UNIT DIVISION: 0
Unit division: 0

## 2017-02-27 LAB — BPAM CRYOPRECIPITATE
Blood Product Expiration Date: 201812230600
Blood Product Expiration Date: 201812230735
Blood Product Expiration Date: 201812230830
ISSUE DATE / TIME: 201812230026
ISSUE DATE / TIME: 201812230157
ISSUE DATE / TIME: 201812230252
UNIT TYPE AND RH: 6200
UNIT TYPE AND RH: 6200
Unit Type and Rh: 6200

## 2017-02-28 NOTE — Addendum Note (Signed)
Addendum  created 02/28/17 1458 by Adair LaundryPaxton, Mellanie Bejarano A, CRNA   Intraprocedure Meds edited

## 2017-03-03 MED FILL — Medication: Qty: 1 | Status: AC

## 2017-03-07 NOTE — Progress Notes (Signed)
   03/05/2017 0000  Clinical Encounter Type  Visited With Family  Visit Type Trauma  Referral From Nurse  Consult/Referral To Chaplain  Spiritual Encounters  Spiritual Needs Emotional;Prayer  Stress Factors  Family Stress Factors Exhausted;Family relationships;Health changes;Major life changes  Chaplain was callled to be with PT family while CPR was performed.  Chaplain helped assist in calling family members, consoling the family outside the room, working with son of PT on stress.

## 2017-03-07 NOTE — Progress Notes (Signed)
RT called to pt room approximately 22:00 for coded pt.  RT arrived to find pt on luken's for compressions and being bagged.  RT attempted intubation without success per MD, who intervened and intubated pt with MAC 3 direct with etco2 detector color change.  RT continued to bag pt for approx 20 mins before ROSC.  RT transported pt with RN and NT to OR.  In route pt became pulseless again and luken's was again initiated.  RT bagged pt until we reached the OR and report was given to CRNA, Wells Guiles and RT was dismissed.

## 2017-03-07 NOTE — Transfer of Care (Signed)
Immediate Anesthesia Transfer of Care Note  Patient: Ethan Avery  Procedure(s) Performed: ANEURYSM ABDOMINAL AORTIC REPAIR (N/A ) ABDOMINAL AORTIC ENDOVASCULAR STENT GRAFT  Patient Location: ICU  Anesthesia Type:General  Level of Consciousness: Patient remains intubated per anesthesia plan  Airway & Oxygen Therapy: Patient remains intubated per anesthesia plan and Patient placed on Ventilator (see vital sign flow sheet for setting)  Post-op Assessment: Report given to RN  Post vital signs: Reviewed  Last Vitals:  Vitals:   11-15-16 2206 11-15-16 2217  BP:    Pulse: (!) 0 (!) 0  Resp:    Temp:    SpO2:  (!) 73%    Last Pain:  Vitals:   11-15-16 2045  TempSrc: Rectal  PainSc:          Complications: No apparent anesthesia complications

## 2017-03-07 NOTE — Anesthesia Procedure Notes (Signed)
Arterial Line Insertion Start/EndFeb 26, 2018 11:11 PM, May 02, 2016 11:19 PM Performed by: Achille RichHodierne, Lanette Ell, MD, anesthesiologist  Patient location: OR. Preanesthetic checklist: patient identified, IV checked and monitors and equipment checked Emergency situation Patient sedated Right, brachial was placed Catheter size: 20 G Hand hygiene performed  and maximum sterile barriers used   Attempts: 1 Procedure performed using ultrasound guided (image not saved) technique. Ultrasound Notes:anatomy identified, needle tip was noted to be adjacent to the nerve/plexus identified and no ultrasound evidence of intravascular and/or intraneural injection Following insertion, line sutured, dressing applied and Biopatch. Post procedure assessment: unchanged  Patient tolerated the procedure well with no immediate complications.

## 2017-03-07 NOTE — Death Summary Note (Addendum)
Critical care consulted by Dr. Myra GianottiBrabham persistent with management postop for ruptured AAA.  At the time of my evaluation the patient did sustain multiple cardiac arrest.  He was intubated unresponsive.  He was on supra therapeutic doses of norepinephrine as well as epinephrine and vasopressin.  He was on maximum respiration ventilation support with invasive mechanical ventilator.  Additionally he was being aggressively resuscitated with blood products for ongoing acute blood loss anemia due to his ruptured aneurysm.Unfortunately despite this maximum surgical and medical therapy he progressed to multiorgan failure with severe metabolic and respiratory acidosis refractory hypoxemia despite high PEEP and 100% FiO2.  Additionally he continues to suffer from ongoing massive blood loss and coagulopathy.  Given the extent of his multiorgan failure and unsuccessful attempts to correct organ failure I spoke to his cousin Annmarie who is the only family that we could be in touch with expressed that in my medical opinion he could not survive and that I would not recommend any further care.  Patient progressed to PEA and asystole and was pronounced at 5:34 AM.  Upon my evaluation, this patient had a high probability of imminent or life-threatening deterioration due to ruptured AAA, metabolic acidosis, acute hypoxic and hypercpaneic respiratory failure, hemorrhagic shock  high complexity decision making to assess, manipulate, and support vital organ system failure including vasopressors, mechanical ventilation, blood transfusion, goals of care discussions  I have personally provided 65 minutes of critical care time exclusive of time spent on separately billable procedures and education.   Condition: deceased  Caro Larocheennis M. Charon Akamine, MD  Critical Care Medicine [facility] Pager: [phone]

## 2017-03-07 NOTE — Op Note (Signed)
Patient name: Ethan Avery MRN: [id number] DOB: 07/18/1950 Sex: male  2016-11-28 - 02/16/2017 Pre-operative Diagnosis: Ruptured abdominal aortic aneurysm Post-operative diagnosis:  Same Surgeon:  Durene CalWells Brabham Assistants:  Inetta Fermoina  Procedure:   #1: Endovascular repair of ruptured abdominal aortic aneurysm   #2: Ultrasound-guided bilateral common femoral artery access   #3: Bilateral open common femoral artery exposure   #4: Catheter in aorta x2   #5: Distal extension x2   #6: Exploratory laparotomy with evacuation of hematoma   #7: Temporary wound VAC abdominal closure   #8: Wound VAC, bilateral groins   #9: Abdominal aortogram Anesthesia: General Blood Loss:  See anesthesia record Specimens: None  Findings: Complete exclusion of the aneurysm with endovascular stent graft.  The patient developed abdominal compartment syndrome requiring exploratory laparotomy and decompression.  Devices Used:   Main body was primary left Gore 28 x 14 x 12.  Ipsilateral extension was a Gore 16 x 9.5.  Contralateral right 16 x 12 x 7 followed by 16 x 16 x 13.5  Indications: The patient presented to the emergency department with severe back pain.  He underwent a CT angiogram which revealed a ruptured 10 cm infrarenal abdominal aortic aneurysm.  When I was called, he had arrested and chest compressions had been initiated.  I arrived within 10 minutes of being contacted.  Chest compressions were ongoing for approximately 45 minutes.  He was getting resuscitated but did not return of pulse.  Just before contemplating discontinuation of therapy, the patient regained a pulse.  After discussions with the family we decided to go to the operating room for attempted repair of his aneurysm.  Procedure:  The patient was identified in the holding area and taken to [facility] OR ROOM 16  The patient was then placed supine on the table. general anesthesia was administered.  The patient was prepped and draped in the usual sterile  fashion.  A time out was called and antibiotics were administered.  The patient lost his pulse in route to the operating room and therefore chest compressions were initiated.  At this point, Betadine was applied to the right groin after shaving his abdomen and groins.  I used ultrasound to cannulate the right common femoral artery with an 18-gauge needle.  I advanced a 035 Bentson wire into the thoracic aorta with a Berenstein 2 catheter and fluoroscopic guidance.  An Amplatz superstiff wire was placed.  I then inserted a 12 French dry seal Gore sheath and then a q.-50 balloon was placed in the descending thoracic aorta for proximal control.  After this maneuver, continuation of resuscitation was performed.  The patient did regain a pulse and appeared to stabilize.  Anesthesia continued to place a central line and arterial line.  Once the patient had stabilized, the abdomen and groins were prepped and draped sterilely.  I then used ultrasound to gain access into the left common femoral artery with an 18-gauge needle.  With the use of a Berenstein 2 catheter and a Bentson wire, I got wire access into the descending thoracic aorta.  A wire exchange was performed and the Amplatz superstiff wire was placed.  I then inserted a 18 French dry seal Gore sheath into the abdominal aorta.  A second access through the 18 French sheath was obtained and an abdominal aortogram was performed locating the renal arteries.  I prepared the main body on the back table this was a Gore 28 x 14 x 12 device.  It was then advanced over  the wire and deployed at the level of the lower right renal artery down to the contralateral gate.  The image detector was rotated to a right anterior oblique position and a retrograde injection was performed through the sheath locating the left hypogastric artery.  The remaining ipsilateral limb was fully deployed and the delivery system was removed.  A distal extension was then inserted.  This was a Gore 16  x 9.5 device.  It landed just proximal to the left hypogastric artery.  The delivery system was removed.  I then inserted a second Q-50 balloon through the ipsilateral limb.  The occlusion balloon from the right side was let down and removed and the new Q-50 balloon was inflated within the main body of the graft.  A second access into the 12 French sheath was obtained leaving the Amplatz superstiff wire that was already present in place.  I then cannulated the gait using a Bentson wire and a Berenstein 2 catheter.  An Amplatz superstiff wire was placed.  The image detector was then rotated to a left anterior oblique position and a retrograde injection was performed locating the right hypogastric artery.  I had to place a bridging piece in order to have the appropriate length to get to the hypogastric artery.  I first deployed a Gore 16 x 12 x 7 device and then inserted a Gore 16 x 16 x 13.5 device landing at the level of the right hypogastric artery.  I then used the Q-50 balloon to mold the attachment sites as well as device overlap.  A completion arteriogram was then performed which showed successful exclusion of the aneurysm with continued patency of the bilateral renals bilateral hypogastric and bilateral external iliac arteries.  Because pro-glide devices had not been deployed for pre-closure, I had to perform open exposure of both femoral arteries in order to remove the sheath.  I was able to get proximal and distal control of both arteries and occluded them with a clamp.  The sheaths were removed bilaterally.  Appropriate flushing maneuvers were performed.  I then closed both arteriotomies with running 5-0 Prolene.  The patient had a appropriate Doppler signals in both femoral arteries.  At this point the patient was having significant problems with peak airway pressures, consistent with abdominal compartment syndrome.  Decompressive laparotomy was required.  A midline incision was then made from the  xiphoid down below the umbilicus.  Cautery was used to divide subtenons tissue down to the fascia which was then opened and extended throughout the length of the incision.  Significant hematoma was evacuated.  I explored the retroperitoneum.  There was no active bleeding.  Temporary abdominal closure was performed with an abdominal wound VAC.  I also placed a wound VAC in both groins after closing the subcutaneous tissue over the arteriotomy with a 2-0 Vicryl.  Wound vacs were placed in both groins.  The patient remained hemodynamically unstable, however he continued to have a blood pressure of 80-100 and a pulse rate in the 120s.  He was placed on the transport vent and taken to the intensive care unit in critical condition.   Disposition: To ICU in critical condition   V. Durene CalWells Brabham, M.D. [facility] [place] Office: [phone] Pager:  [phone]

## 2017-03-07 NOTE — Progress Notes (Signed)
eLink Physician-Brief Progress Note Patient Name: Ethan Avery DOB: 05/24/1950 MRN: [id number]   Date of Service  02/10/2017  HPI/Events of Note  Critically ill post-op patient s/p aneurysm repair.  Coded prior to OR and now bradying down.  Now with severe acidosis on EPI, NE, and VASO gtts.  eICU Interventions  Vent rate increased to 35 to facilitate improvement in pH Additional Bicarb and bicarb gtt Additional EPI dosiing Calcium IV in anticipation of hyperkalemia Check stat lytes and ABG Bedside MD to update family re critical nature of patient's condition.     Intervention Category Major Interventions: Code management / supervision;Respiratory failure - evaluation and management  DETERDING,ELIZABETH 02/10/2017, 4:13 AM

## 2017-03-07 NOTE — Anesthesia Procedure Notes (Signed)
Central Venous Catheter Insertion Performed by: Achille RichHodierne, Olanrewaju Osborn, MD, anesthesiologist Start/End12/06/2016 11:01 PM, 02/25/2017 11:08 PM Patient location: OR. Emergency situation Preanesthetic checklist: patient identified, IV checked, monitors and equipment checked and timeout performed Position: Trendelenburg Patient sedated Hand hygiene performed , maximum sterile barriers used  and Seldinger technique used Catheter size: 7 Fr Central line was placed.Double lumen Procedure performed using ultrasound guided (image not saved) technique. Ultrasound Notes:anatomy identified, needle tip was noted to be adjacent to the nerve/plexus identified and no ultrasound evidence of intravascular and/or intraneural injection Attempts: 1 Following insertion, line sutured, dressing applied and Biopatch. Post procedure assessment: blood return through all ports, free fluid flow and no air  Patient tolerated the procedure well with no immediate complications.

## 2017-03-07 NOTE — Anesthesia Postprocedure Evaluation (Signed)
Anesthesia Post Note  Patient: Ethan Avery  Procedure(s) Performed: ANEURYSM ABDOMINAL AORTIC REPAIR (N/A ) ABDOMINAL AORTIC ENDOVASCULAR STENT GRAFT     Patient location during evaluation: SICU Anesthesia Type: General Level of consciousness: sedated Pain management: pain level controlled Vital Signs Assessment: vitals unstable Respiratory status: patient remains intubated per anesthesia plan and respiratory function unstable Cardiovascular status: unstable Postop Assessment: no apparent nausea or vomiting Anesthetic complications: no Comments: Pt is coagulopathic, hypotensive, acidotic, hypercarbic, and hypoxic.  On levophed, vasopressin, and epinephrine gtts.  High airway pressures.  Several episodes of cardiovascular arrest since arrival to the hospital and in the OR.  Care transferred to critical care physician in ICU.    Last Vitals:  Vitals:   2016/12/27 0515 2016/12/27 0530  BP:    Pulse: 91   Resp: (!) 32 (!) 31  Temp: (!) 32.8 C (!) 32.7 C  SpO2: (!) 40%     Last Pain:  Vitals:   02/25/2017 2045  TempSrc: Rectal  PainSc:                  Saagar Tortorella S

## 2017-03-07 NOTE — H&P (Signed)
   [facility]  Patient name: Ethan Avery MRN: [id number] DOB: 02/03/1951 Sex: male   REQUESTING PROVIDER:    ER   REASON FOR CONSULT:    Ruptured abdominal aortic aneurysm  HISTORY OF PRESENT ILLNESS:   Ethan Avery is a 67 y.o. male, who presented to the emergency department with abdominal and back pain.  He underwent a CT scan which showed a 9.5 cm ruptured infrarenal abdominal aortic aneurysm.  Upon returning from CT scan he went into cardiac arrest and chest compressions and ACLS protocol were initiated.  I was contacted after CPR had been initiated and was present within 10 minutes.  We continued with CPR.  After approximately 45 minutes he did regain a carotid pulse and I made the decision to take him emergently to the operating room.  PAST MEDICAL HISTORY    Negative for premature cardiovascular diseaseUnable to obtain given acute medical issues  FAMILY HISTORY  Unable to obtain given acute medical issues  SOCIAL HISTORY:   Social History   Socioeconomic History  . Marital status: Unknown    Spouse name: Not on file  . Number of children: Not on file  . Years of education: Not on file  . Highest education level: Not on file  Social Needs  . Financial resource strain: Not on file  . Food insecurity - worry: Not on file  . Food insecurity - inability: Not on file  . Transportation needs - medical: Not on file  . Transportation needs - non-medical: Not on file  Occupational History  . Not on file  Tobacco Use  . Smoking status: Former Smoker    Last attempt to quit: 03/08/2011    Years since quitting: 5.9  . Smokeless tobacco: Never Used  Substance and Sexual Activity  . Alcohol use: No    Frequency: Never  . Drug use: No  . Sexual activity: No  Other Topics Concern  . Not on file  Social History Narrative  . Not on file    ALLERGIES:    No Known Allergies  CURRENT MEDICATIONS:     Unable to obtain given acute medical issues  REVIEW OF SYSTEMS:   Unable to obtain  PHYSICAL EXAM:   Vitals:   02/15/2017 0445 02/25/2017 0500 03/06/2017 0515 02/28/2017 0530  BP:      Pulse: (!) 117 (!) 107 91   Resp: (!) 35 (!) 35 (!) 32 (!) 31  Temp: (!) 91.4 F (33 C) (!) 91.2 F (32.9 C) (!) 91 F (32.8 C) (!) 90.9 F (32.7 C)  TempSrc:      SpO2: (!) 60% (!) 46% (!) 40%   Weight:  279 lb 8.7 oz (126.8 kg)    Height:  5\' 11"  (1.803 m)     Upon my arrival, chest compressions were being performed.  The patient had a tense distended abdomen.  He was intubated.  STUDIES:   I have reviewed his CT scan which shows a large intrarenal ruptured abdominal aortic aneurysm  ASSESSMENT and PLAN   We were able to regain a pulse in his carotid artery.  I discussed with the family that we will go emergently to the operating room for attempted repair.   Durene CalWells Karilynn Carranza, MD [facility] Tel [phone] Pager [phone]

## 2017-03-07 DEATH — deceased

## 2019-06-29 IMAGING — CT CT ANGIO CHEST-ABD-PELV FOR DISSECTION W/ AND WO/W CM
2 of 7 series · 12 of 46 positions shown, 14 images · IV contrast (APPLIED)
Comparison: Chest radiograph dated 02/25/2017

CLINICAL DATA: 66-year-old male with chest pain. Concern for
dissection.

EXAM:
CT ANGIOGRAPHY CHEST, ABDOMEN AND PELVIS
TECHNIQUE: Multidetector CT imaging through the chest, abdomen and pelvis was
performed using the standard protocol during bolus administration of
intravenous contrast. Multiplanar reconstructed images and MIPs were
obtained and reviewed to evaluate the vascular anatomy.
CONTRAST:  100mL [id number] IOPAMIDOL ([id number]) INJECTION 76%

[Series 7: arterial · axial · arterial · 0.81mm/px · z∈[-650,-82]mm · 9 of 356 slices shown, 11 images]
[im 36/356  soft-tissue]
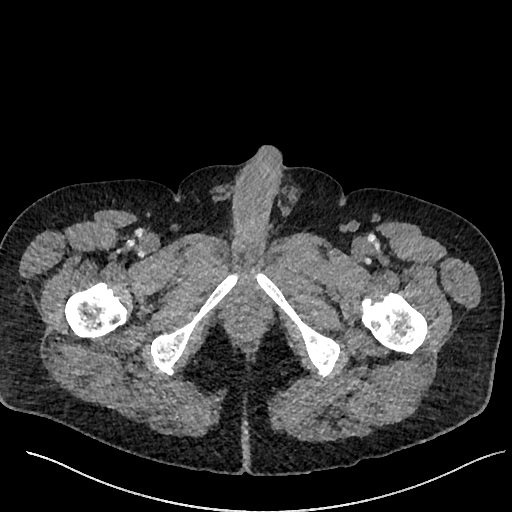
[im 36/356  bone]
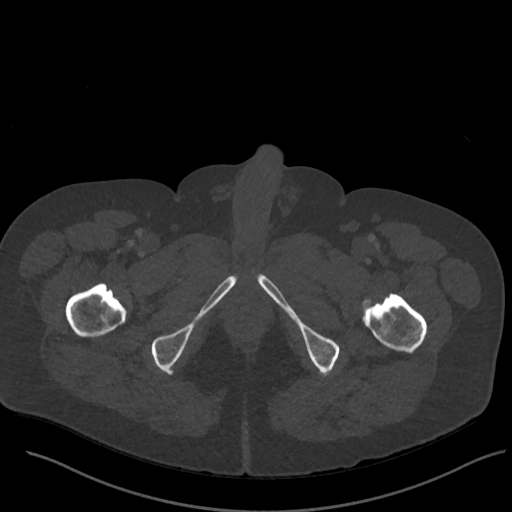
[im 72/356  soft-tissue]
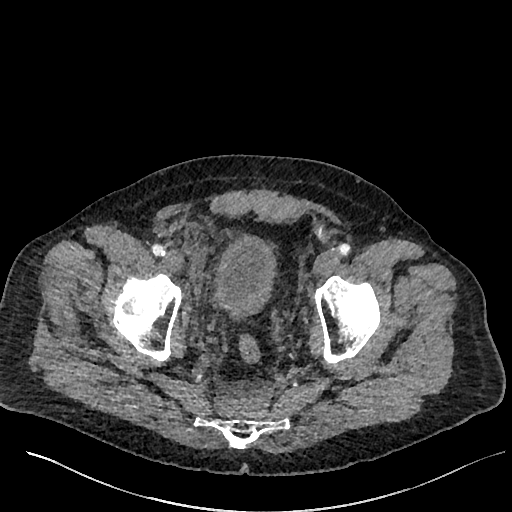
[im 107/356  soft-tissue]
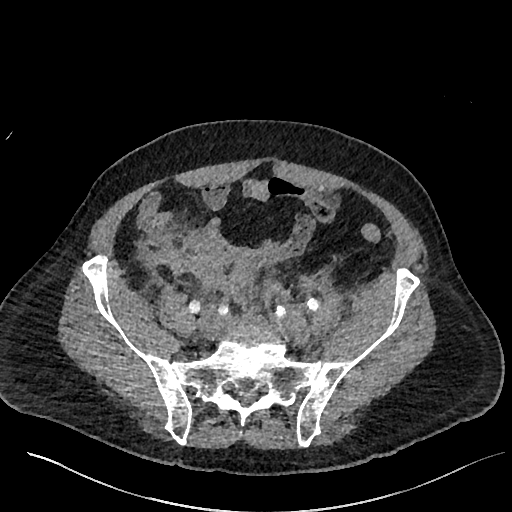
[im 143/356  soft-tissue]
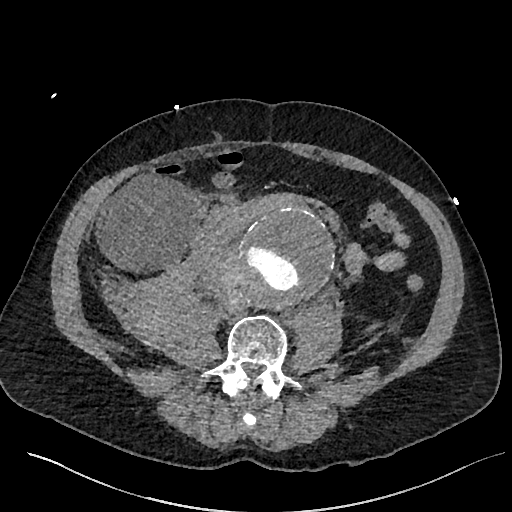
[im 178/356  soft-tissue]
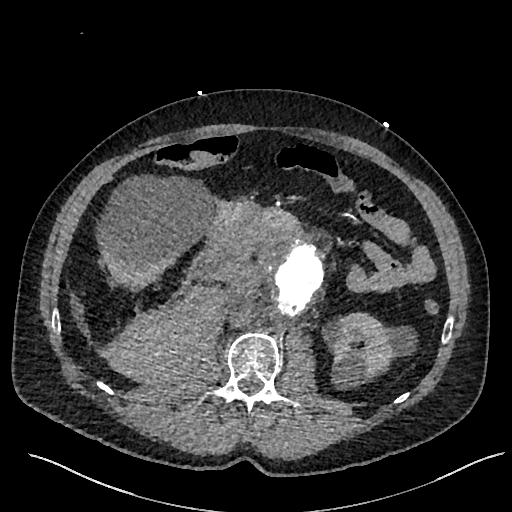
[im 214/356  soft-tissue]
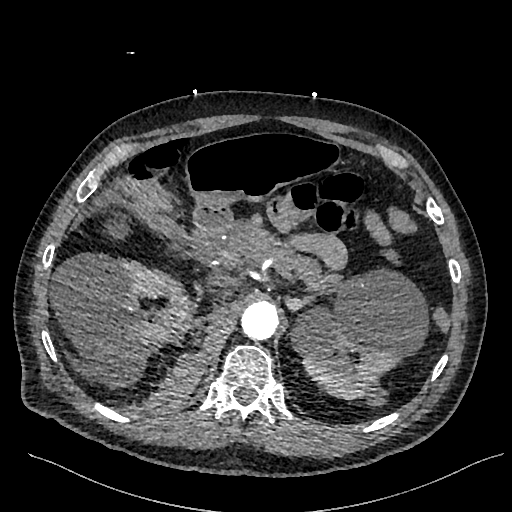
[im 249/356  soft-tissue]
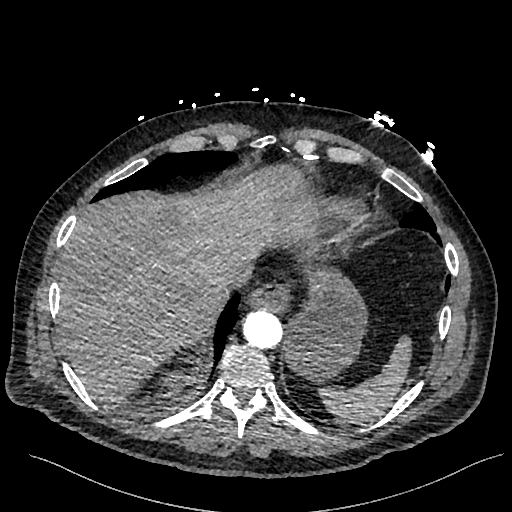
[im 285/356  soft-tissue]
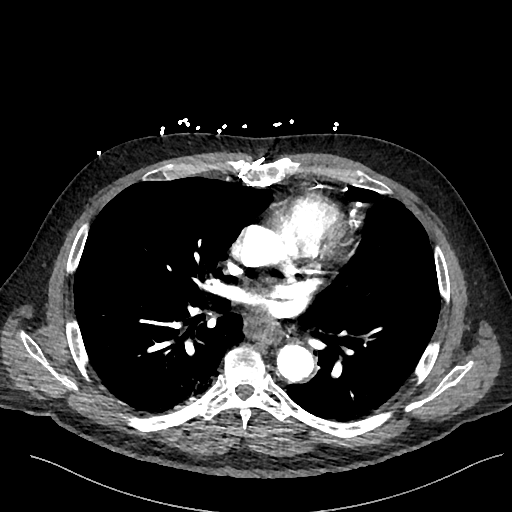
[im 320/356  soft-tissue]
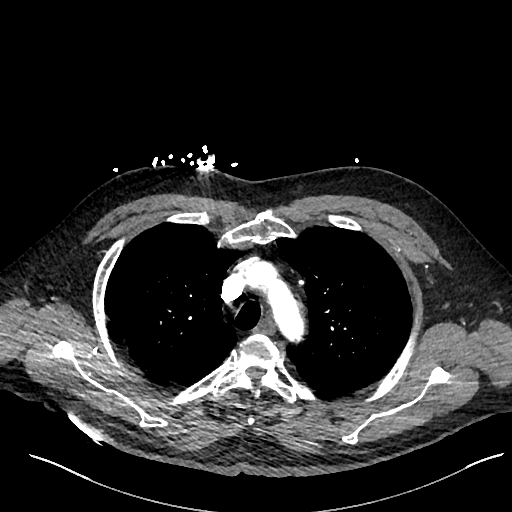
[im 320/356  bone]
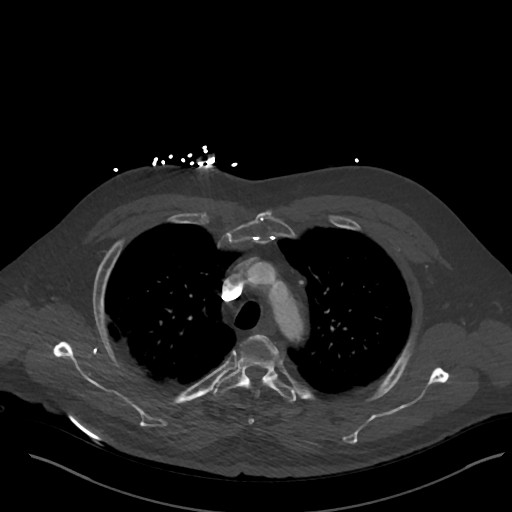

[Series 10: cor · coronal · 0.94mm/px · 3 of 142 slices shown]
[im 36/142  soft-tissue]
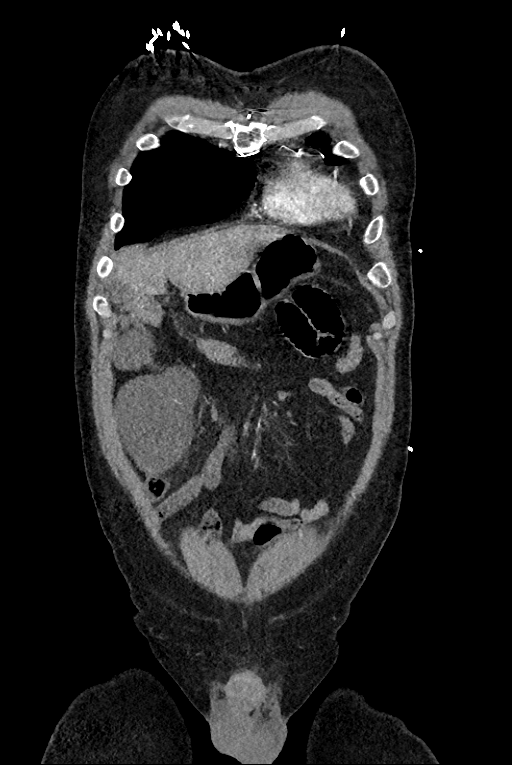
[im 71/142  soft-tissue]
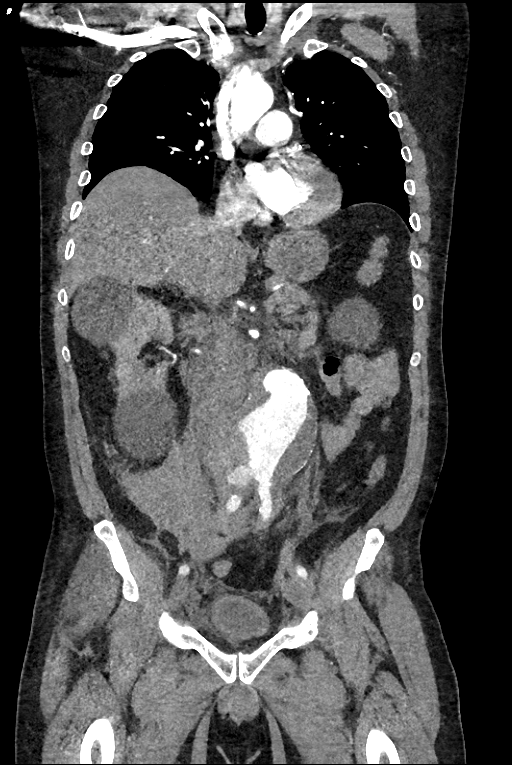
[im 106/142  soft-tissue]
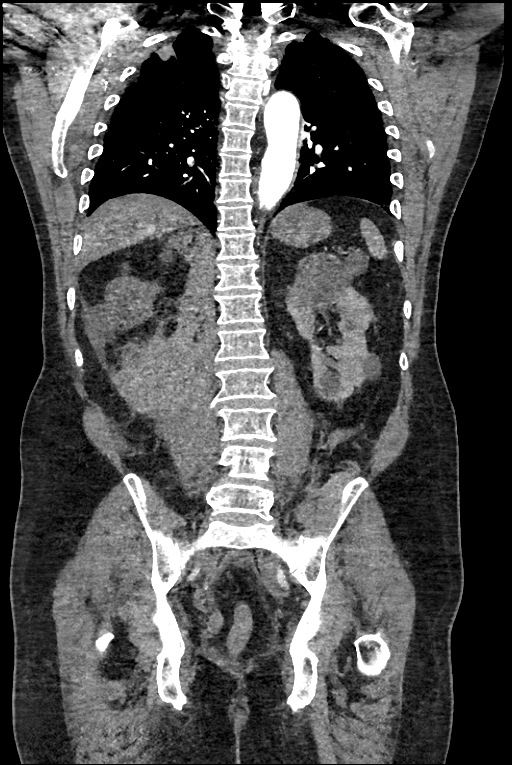

[12 of 46 positions shown; findings below may reference images not displayed]

FINDINGS: CTA CHEST FINDINGS

Cardiovascular: There is no cardiomegaly or pericardial effusion.
Multi vessel coronary vascular calcification and CABG surgery noted.
There is mild atherosclerotic calcification of the thoracic aorta.
No aneurysmal dilatation or evidence of dissection. The origins of
the great vessels of the aortic arch appear patent. The main
pulmonary trunk and central pulmonary arteries appear unremarkable.
There is clot within the segmental branch of the right lower lobe
(series 7, image 77) consistent with pulmonary embolus.

Mediastinum/Nodes: No hilar or mediastinal adenopathy. Fluid noted
within the esophagus which may represent gastroesophageal reflux.
Esophagitis is not excluded. Clinical correlation is recommended. No
mediastinal fluid collection.

Lungs/Pleura: There is mild centrilobular and paraseptal emphysema.
Large right apical subpleural collapse measure up to 4 cm in
diameter. There is a 3.1 x 1.9 cm somewhat lobulated right apical
mass. There is no focal consolidation, pleural effusion, or
pneumothorax. Bibasilar linear atelectasis/scarring noted. The
central airways are patent.

Musculoskeletal: Median sternotomy wires. There is degenerative
changes of the spine. No acute osseous pathology.

Review of the MIP images confirms the above findings.

CTA ABDOMEN AND PELVIS FINDINGS

VASCULAR

Aorta: There is atherosclerotic calcification of the abdominal
aorta. There is an infrarenal abdominal aortic aneurysm measuring up
to 9.5 cm in greatest axial diameter. The aneurysm measures
approximately 11 cm in craniocaudal length. The aneurysm starts
approximately 1.9 cm inferior to the origin of the right renal
artery and extends to the level of aortic bifurcation. There is non
opacification of the majority of the lumen of the aneurysm which may
represent intramural hematoma. There is extravasation of contrast
outside of the confines of the aneurysm wall consistent with rupture
and active bleed. There is large amount of blood surrounding the
aorta and extending into the right retroperitoneal space and
inferiorly into the pelvis.

Celiac: Patent without evidence of aneurysm, dissection, vasculitis
or significant stenosis.

SMA: The SMA is patent. There is a replaced right hepatic artery
from the SMA.

Renals: The renal arteries are patent.

IMA: The IMA is not well visualized.

Inflow: There is atherosclerotic calcification of the iliac
arteries. The iliac arteries appear patent. Evaluation of these
vessels is limited due to surrounding hematoma.

Veins: There is dilated appearance of the iliac veins which may be
related to mass effect on the inferior IVC caused by abdominal
aortic aneurysm.

Review of the MIP images confirms the above findings.

NON-VASCULAR

There is no intra-abdominal free air.

Evaluation of the intra-abdominal organs is limited due to streak
artifact.

Hepatobiliary: Small left hepatic hypodense lesions are not well
characterized. No intrahepatic biliary ductal dilatation. The
gallbladder is grossly unremarkable.

Pancreas: There is a 1.9 x 3.3 x 2.5 cm low attenuating lesion in
the tail of the pancreas (series 7, image 122 and series 10, image
106) this is not well characterized. Further evaluation with MRI on
a nonemergent versus recommended.

Spleen: Normal in size without focal abnormality.

Adrenals/Urinary Tract: The adrenal glands are unremarkable. There
are innumerable bilateral renal hypodense lesions which are not well
characterized. The larger lesions demonstrate fluid attenuation most
consistent with cysts. These measure up to 10 cm in the inferior
pole of the right kidney. There is moderate bilateral renal
parenchyma atrophy. No hydronephrosis. The urinary bladder is
predominantly collapsed.

Stomach/Bowel: There is no bowel obstruction or active inflammation.
The appendix is not visualized with certainty.

Lymphatic: No adenopathy. Evaluation is however limited due to
extensive retroperitoneal hematoma.

Reproductive: The prostate and seminal vesicles are grossly
unremarkable.

Other: None

Musculoskeletal: Degenerative changes of the spine. No acute osseous
pathology.

Review of the MIP images confirms the above findings.
IMPRESSION: 1. Ruptured infrarenal abdominal aortic aneurysm with active
hemorrhage. There is a large amount of blood in the abdomen.
2. Right lower lobe segmental pulmonary artery embolus. No CT
evidence of right heart straining.
3. A 3.1 x 1.9 cm right upper lobe mass. Consider one of the
following in 3 months for both low-risk and high-risk individuals:
(a) repeat chest CT, (b) follow-up PET-CT, or (c) tissue sampling.
This recommendation follows the consensus statement: Guidelines for
Management of Incidental Pulmonary Nodules Detected on CT Images:
4. Hypodense lesion in the tail of the pancreas. Further
characterization with MRI on a nonemergent basis recommended.
5. Bilateral renal cysts.
6. Aortic Atherosclerosis (3138Y-L55.5) and Emphysema (3138Y-13L.C).
These results were called by telephone at the time of interpretation
on 02/25/2017 at [DATE] to Dr. Franz, who verbally acknowledged
these results.

## 2019-06-30 IMAGING — DX DG CHEST 1V PORT
1 series · 1 of 1 positions shown · non-contrast
Comparison: Chest radiograph and CTA of the chest performed
02/25/2017

CLINICAL DATA: Endotracheal tube placement.  Status post code.

EXAM:
PORTABLE CHEST 1 VIEW

[chest ap]
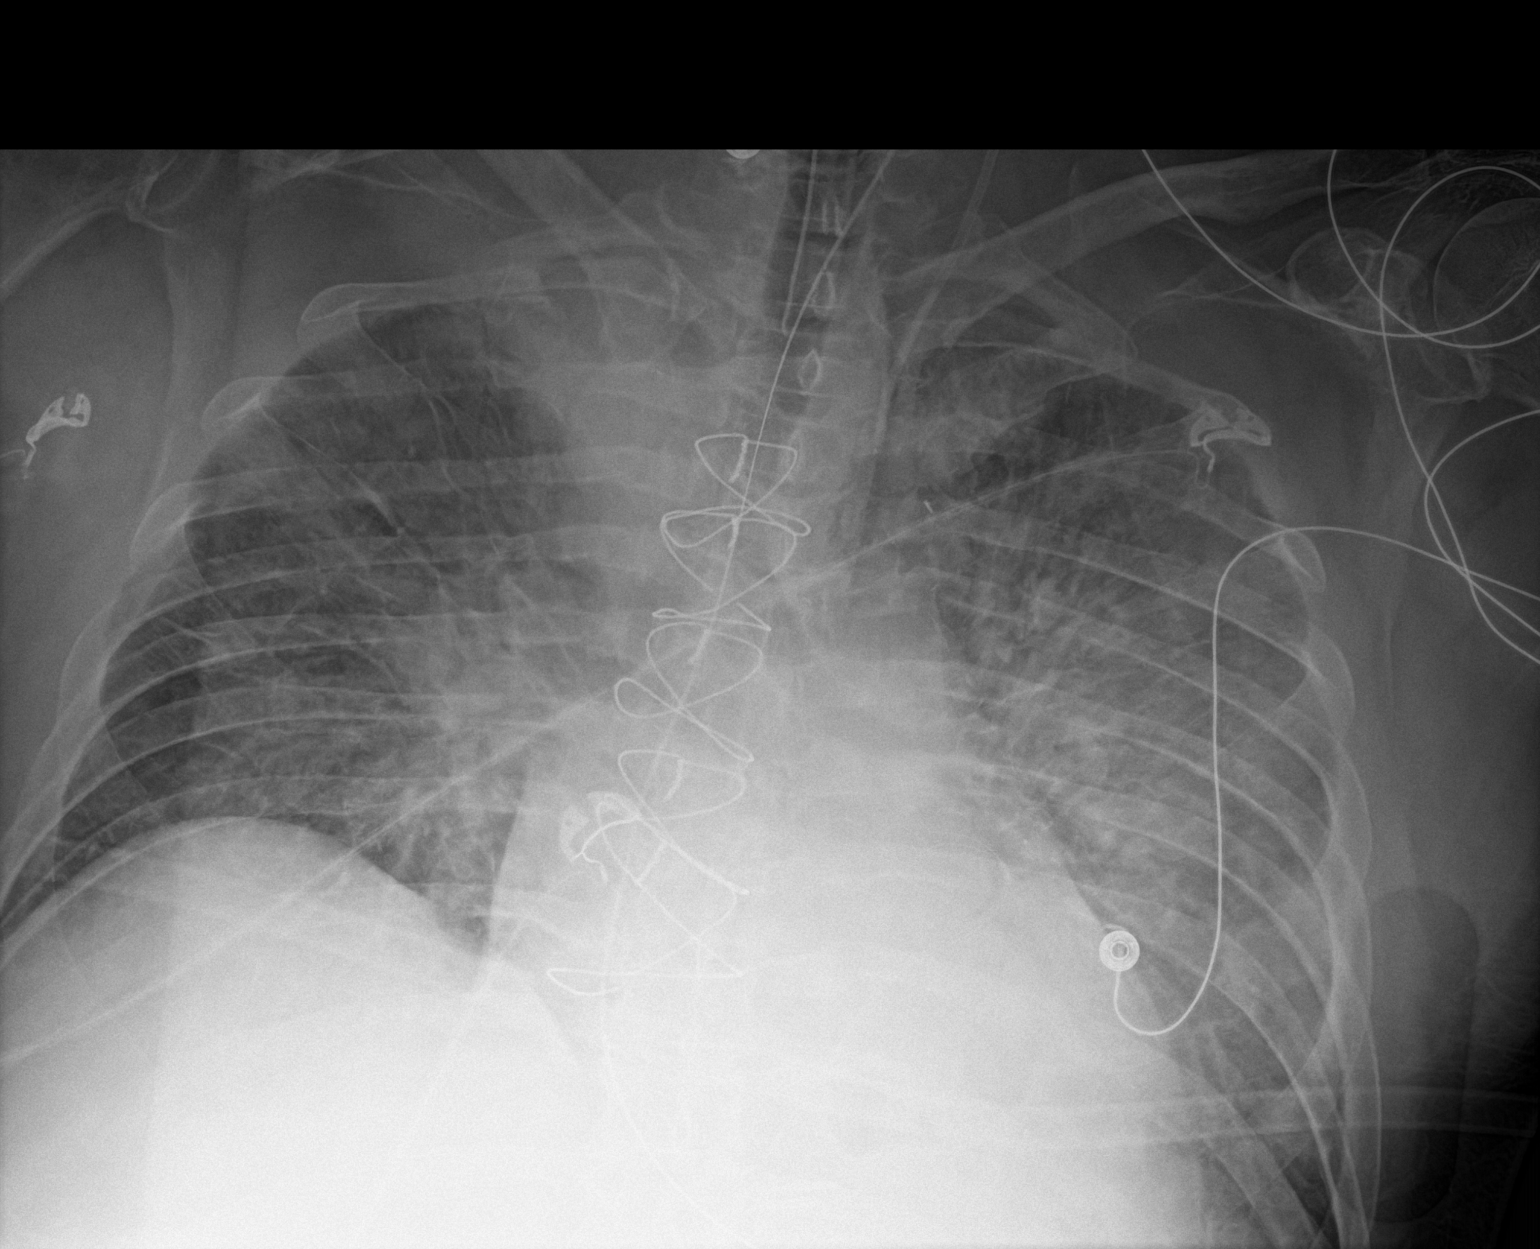

[1 of 1 positions shown; findings below may reference images not displayed]

FINDINGS: The patient's endotracheal tube is seen ending 6 cm above the
carina. A left IJ line is noted ending about the left
brachiocephalic vein.

New flash pulmonary edema is noted. Right apical airspace opacity
reflects a combination of atelectasis and the patient's known lung
mass. No pleural effusion or pneumothorax is seen, though the left
costophrenic angle is incompletely imaged on this study.

The cardiomediastinal silhouette is normal in size. The patient is
status post median sternotomy. The patient's enteric tube extends
below the diaphragm. No acute osseous abnormalities are seen.
IMPRESSION: 1. Endotracheal tube seen ending 6 cm above the carina.
2. New flash pulmonary edema.
3. Right apical airspace opacity reflects a combination of
atelectasis and the patient's known lung mass.
